# Patient Record
Sex: Male | Born: 1985
Health system: Southern US, Community
[De-identification: ages and names within clinical notes are randomized; demographics above are authoritative.]

## PROBLEM LIST (undated history)

## (undated) DIAGNOSIS — J45909 Unspecified asthma, uncomplicated: Secondary | ICD-10-CM

## (undated) DIAGNOSIS — I10 Essential (primary) hypertension: Secondary | ICD-10-CM

---

## 2020-07-19 ENCOUNTER — Encounter (HOSPITAL_COMMUNITY): Payer: Self-pay | Admitting: Emergency Medicine

## 2020-07-19 ENCOUNTER — Other Ambulatory Visit: Payer: Self-pay

## 2020-07-19 ENCOUNTER — Emergency Department (HOSPITAL_COMMUNITY): Payer: Self-pay

## 2020-07-19 ENCOUNTER — Emergency Department (HOSPITAL_COMMUNITY)
Admission: EM | Admit: 2020-07-19 | Discharge: 2020-07-19 | Disposition: A | Discharge status: Home / Self Care | Payer: Self-pay | Attending: Emergency Medicine | Admitting: Emergency Medicine

## 2020-07-19 DIAGNOSIS — J4541 Moderate persistent asthma with (acute) exacerbation: Secondary | ICD-10-CM

## 2020-07-19 DIAGNOSIS — Z8616 Personal history of COVID-19: Secondary | ICD-10-CM | POA: Insufficient documentation

## 2020-07-19 DIAGNOSIS — Z20822 Contact with and (suspected) exposure to covid-19: Secondary | ICD-10-CM | POA: Insufficient documentation

## 2020-07-19 DIAGNOSIS — I10 Essential (primary) hypertension: Secondary | ICD-10-CM | POA: Insufficient documentation

## 2020-07-19 DIAGNOSIS — J45909 Unspecified asthma, uncomplicated: Secondary | ICD-10-CM | POA: Insufficient documentation

## 2020-07-19 HISTORY — DX: Unspecified asthma, uncomplicated: J45.909

## 2020-07-19 HISTORY — DX: Essential (primary) hypertension: I10

## 2020-07-19 LAB — CBC WITH DIFFERENTIAL/PLATELET
Abs Immature Granulocytes: 0.01 10*3/uL (ref 0.00–0.07)
Basophils Absolute: 0.1 10*3/uL (ref 0.0–0.1)
Basophils Relative: 1 %
Eosinophils Absolute: 0.7 10*3/uL — ABNORMAL HIGH (ref 0.0–0.5)
Eosinophils Relative: 8 %
HCT: 41.9 % (ref 39.0–52.0)
Hemoglobin: 12.9 g/dL — ABNORMAL LOW (ref 13.0–17.0)
Immature Granulocytes: 0 %
Lymphocytes Relative: 47 %
Lymphs Abs: 4.1 10*3/uL — ABNORMAL HIGH (ref 0.7–4.0)
MCH: 26.8 pg (ref 26.0–34.0)
MCHC: 30.8 g/dL (ref 30.0–36.0)
MCV: 87.1 fL (ref 80.0–100.0)
Monocytes Absolute: 0.7 10*3/uL (ref 0.1–1.0)
Monocytes Relative: 8 %
Neutro Abs: 3.2 10*3/uL (ref 1.7–7.7)
Neutrophils Relative %: 36 %
Platelets: 253 10*3/uL (ref 150–400)
RBC: 4.81 MIL/uL (ref 4.22–5.81)
RDW: 15.5 % (ref 11.5–15.5)
WBC: 8.7 10*3/uL (ref 4.0–10.5)
nRBC: 0 % (ref 0.0–0.2)

## 2020-07-19 LAB — BASIC METABOLIC PANEL
Anion gap: 9 (ref 5–15)
BUN: 8 mg/dL (ref 6–20)
CO2: 29 mmol/L (ref 22–32)
Calcium: 9.5 mg/dL (ref 8.9–10.3)
Chloride: 103 mmol/L (ref 98–111)
Creatinine, Ser: 0.75 mg/dL (ref 0.61–1.24)
GFR, Estimated: >60 mL/min (ref >60)
Glucose, Bld: 111 mg/dL — ABNORMAL HIGH (ref 70–99)
Potassium: 4.1 mmol/L (ref 3.5–5.1)
Sodium: 141 mmol/L (ref 135–145)

## 2020-07-19 LAB — RESP PANEL BY RT-PCR (FLU A&B, COVID) ARPGX2
Influenza A by PCR: NEGATIVE
Influenza B by PCR: NEGATIVE
SARS Coronavirus 2 by RT PCR: NEGATIVE

## 2020-07-19 LAB — POC SARS CORONAVIRUS 2 AG -  ED: SARS Coronavirus 2 Ag: NEGATIVE

## 2020-07-19 MED ORDER — METHYLPREDNISOLONE SODIUM SUCC 125 MG IJ SOLR
125.0000 mg | Freq: Once | INTRAMUSCULAR | Status: AC
  Administered 2020-07-19: 125 mg via INTRAVENOUS
  Filled 2020-07-19: qty 2

## 2020-07-19 MED ORDER — PULMICORT FLEXHALER 90 MCG/ACT IN AEPB: 1.0000 | INHALATION_SPRAY | Freq: Two times a day (BID) | RESPIRATORY_TRACT | 0 refills | Status: DC

## 2020-07-19 MED ORDER — IPRATROPIUM-ALBUTEROL 0.5-2.5 (3) MG/3ML IN SOLN
3.0000 mL | Freq: Once | RESPIRATORY_TRACT | Status: AC
  Administered 2020-07-19: 3 mL via RESPIRATORY_TRACT
  Filled 2020-07-19: qty 3

## 2020-07-19 MED ORDER — ALBUTEROL (5 MG/ML) CONTINUOUS INHALATION SOLN
20.0000 mg/h | INHALATION_SOLUTION | Freq: Once | RESPIRATORY_TRACT | Status: AC
  Administered 2020-07-19: 20 mg/h via RESPIRATORY_TRACT
  Filled 2020-07-19: qty 20

## 2020-07-19 MED ORDER — PREDNISONE 20 MG PO TABS: 60.0000 mg | ORAL_TABLET | Freq: Every day | ORAL | 0 refills | Status: DC

## 2020-07-19 MED ORDER — ALBUTEROL SULFATE HFA 108 (90 BASE) MCG/ACT IN AERS
6.0000 | INHALATION_SPRAY | Freq: Once | RESPIRATORY_TRACT | Status: AC
  Administered 2020-07-19: 6 via RESPIRATORY_TRACT
  Filled 2020-07-19: qty 6.7

## 2020-07-19 MED ORDER — MAGNESIUM SULFATE 2 GM/50ML IV SOLN
2.0000 g | Freq: Once | INTRAVENOUS | Status: AC
  Administered 2020-07-19: 2 g via INTRAVENOUS
  Filled 2020-07-19: qty 50

## 2020-07-19 NOTE — ED Triage Notes (Signed)
Patient states since COVID last year, he has had issues with his asthma. Patient states last night he began having a productive cough with green sputum. Patient audibly wheezing, difficulty speaking sentences. Patient states he attempted to use his inhaler and it has not helped.

## 2020-07-19 NOTE — Discharge Instructions (Addendum)
To keep your prescription costs down, check the drug prices using [employer].

## 2020-07-19 NOTE — ED Provider Notes (Signed)
[facility]-EMERGENCY DEPT Provider Note   CSN: [id number] Arrival date & time: 07/19/20  0150   History Chief Complaint  Patient presents with  . Asthma  . Shortness of breath    James Donaldson is a 34 y.o. male.  The history is provided by the patient.  Asthma  He has history of hypertension, asthma and comes in because of worsening asthma problems.  He started having cough and wheezing about 1 week ago and has been using his inhaler.  Inhaler did seem to give relief, but has been working for less and less time.  Today, it got much worse and his inhaler did not seem to be helping.  He has had a cough productive of thick green sputum.  He denies fever, chills, sweats.  He denies loss of sense of smell or taste.  He denies nausea, vomiting, diarrhea.  He denies arthralgias or myalgias.  He denies any sick contacts.  He did have COVID-19 last year, and asthma, which had been quiesced sent prior to the Covid infection, started giving him more problems.  He is a non-smoker.  Past Medical History:  Diagnosis Date  . Asthma   . Hypertension     There are no problems to display for this patient.   History reviewed. No pertinent surgical history.     No family history on file.  Social History   Tobacco Use  . Smoking status: Never Smoker  . Smokeless tobacco: Never Used  Substance Use Topics  . Alcohol use: Yes  . Drug use: Never    Home Medications Prior to Admission medications   Not on File    Allergies    Patient has no known allergies.  Review of Systems   Review of Systems  All other systems reviewed and are negative.   Physical Exam Updated Vital Signs BP (!) 143/102 (BP Location: Left Arm)   Pulse (!) 120   Resp (!) 25   Ht 5\' 7"  (1.702 m)   Wt 95.3 kg   SpO2 96%   BMI 32.89 kg/m   Physical Exam Vitals and nursing note reviewed.   34 year old male, in mild respiratory distress. Vital signs are significant for elevated  heart rate, respiratory rate, blood pressure. Oxygen saturation is 96%, which is normal. Head is normocephalic and atraumatic. PERRLA, EOMI. Oropharynx is clear. Neck is nontender and supple without adenopathy or JVD. Back is nontender and there is no CVA tenderness. Lungs have diffuse inspiratory and expiratory wheezes without rales or rhonchi. Chest is nontender.  He does have some use of accessory muscles of respiration. Heart has regular rate and rhythm without murmur. Abdomen is soft, flat, nontender without masses or hepatosplenomegaly and peristalsis is normoactive. Extremities have no cyanosis or edema, full range of motion is present. Skin is warm and dry without rash. Neurologic: Mental status is normal, cranial nerves are intact, there are no motor or sensory deficits.  ED Results / Procedures / Treatments   Labs (all labs ordered are listed, but only abnormal results are displayed) Labs Reviewed  BASIC METABOLIC PANEL - Abnormal; Notable for the following components:      Result Value   Glucose, Bld 111 (*)    All other components within normal limits  CBC WITH DIFFERENTIAL/PLATELET - Abnormal; Notable for the following components:   Hemoglobin 12.9 (*)    Lymphs Abs 4.1 (*)    Eosinophils Absolute 0.7 (*)    All other components within normal limits  RESP PANEL BY RT-PCR (FLU A&B, COVID) ARPGX2  POC SARS CORONAVIRUS 2 AG -  ED   Radiology DG Chest Port 1 View  Result Date: 07/19/2020 CLINICAL DATA:  Cough EXAM: PORTABLE CHEST 1 VIEW COMPARISON:  None. FINDINGS: There are areas of significant bronchial wall thickening. There are hazy bibasilar airspace opacities. There is no pneumothorax or large pleural effusion. The heart is unremarkable. IMPRESSION: Findings are consistent with reactive airway disease. Electronically Signed   By: Katherine Mantle M.D.   On: 07/19/2020 03:03    Procedures Procedures   Medications Ordered in ED Medications  magnesium sulfate IVPB  2 g 50 mL (has no administration in time range)  methylPREDNISolone sodium succinate (SOLU-MEDROL) 125 mg/2 mL injection 125 mg (has no administration in time range)  ipratropium-albuterol (DUONEB) 0.5-2.5 (3) MG/3ML nebulizer solution 3 mL (has no administration in time range)    ED Course  I have reviewed the triage vital signs and the nursing notes.  Pertinent labs & imaging results that were available during my care of the patient were reviewed by me and considered in my medical decision making (see chart for details).  MDM Rules/Calculators/A&P Asthma exacerbation, likely triggered by viral URI.  Will check chest x-ray and respiratory pathogen panel.  IV started and is given intravenous magnesium and methylprednisolone and he will be given albuterol with ipratropium via nebulizer.  He has no prior records in the [facility] health system, or in Care Everywhere.  Chest x-ray shows no evidence of pneumonia, labs are reassuring.  He had significant improvement with above-noted treatment but still has some wheezing.  We will give him additional albuterol with ipratropium.  Following above-noted treatment, there was still mild to moderate wheezing.  He is given a continuous nebulizer treatment with albuterol.  Following this, the lungs were completely clear.  He is discharged with prescriptions for prednisone and budesonide inhaler.  He is to continue using his albuterol inhaler as needed.  He is given Social research officer, government and referred to pulmonology for further evaluation and treatment.  Return precautions discussed.  James Donaldson was evaluated in Emergency Department on 07/19/2020 for the symptoms described in the history of present illness. He was evaluated in the context of the global COVID-19 pandemic, which necessitated consideration that the patient might be at risk for infection with the SARS-CoV-2 virus that causes COVID-19. Institutional protocols and algorithms that pertain to the  evaluation of patients at risk for COVID-19 are in a state of rapid change based on information released by regulatory bodies including the CDC and federal and state organizations. These policies and algorithms were followed during the patient's care in the ED.  Final Clinical Impression(s) / ED Diagnoses Final diagnoses:  None    Rx / DC Orders ED Discharge Orders    None       Dione Booze, MD 07/19/20 0710

## 2020-10-26 ENCOUNTER — Ambulatory Visit: Payer: Self-pay

## 2020-10-27 ENCOUNTER — Ambulatory Visit (INDEPENDENT_AMBULATORY_CARE_PROVIDER_SITE_OTHER): Payer: Self-pay | Admitting: Nurse Practitioner

## 2020-10-27 ENCOUNTER — Other Ambulatory Visit: Payer: Self-pay | Admitting: Nurse Practitioner

## 2020-10-27 VITALS — BP 116/103 | HR 108 | Temp 97.9°F | Resp 18

## 2020-10-27 DIAGNOSIS — J4540 Moderate persistent asthma, uncomplicated: Secondary | ICD-10-CM

## 2020-10-27 DIAGNOSIS — Z8616 Personal history of COVID-19: Secondary | ICD-10-CM

## 2020-10-27 DIAGNOSIS — R0602 Shortness of breath: Secondary | ICD-10-CM

## 2020-10-27 MED ORDER — BUDESONIDE-FORMOTEROL FUMARATE 80-4.5 MCG/ACT IN AERO: 2.0000 | INHALATION_SPRAY | Freq: Two times a day (BID) | RESPIRATORY_TRACT | 3 refills | Status: DC

## 2020-10-27 MED ORDER — OMEPRAZOLE 20 MG PO CPDR: 20.0000 mg | DELAYED_RELEASE_CAPSULE | Freq: Every day | ORAL | 3 refills | Status: DC

## 2020-10-27 MED ORDER — ALBUTEROL SULFATE HFA 108 (90 BASE) MCG/ACT IN AERS: 2.0000 | INHALATION_SPRAY | Freq: Four times a day (QID) | RESPIRATORY_TRACT | 0 refills | Status: DC | PRN

## 2020-10-27 MED ORDER — MONTELUKAST SODIUM 10 MG PO TABS: 10.0000 mg | ORAL_TABLET | Freq: Every day | ORAL | 3 refills | Status: DC

## 2020-10-27 MED ORDER — MONTELUKAST SODIUM 10 MG PO TABS
10.0000 mg | ORAL_TABLET | Freq: Every day | ORAL | 3 refills | Status: DC
Start: 2020-10-27 — End: 2021-08-31

## 2020-10-27 MED ORDER — CETIRIZINE HCL 10 MG PO TABS
10.0000 mg | ORAL_TABLET | Freq: Every day | ORAL | 11 refills | Status: DC
Start: 2020-10-27 — End: 2021-08-31

## 2020-10-27 MED ORDER — BUDESONIDE-FORMOTEROL FUMARATE 80-4.5 MCG/ACT IN AERO
2.0000 | INHALATION_SPRAY | Freq: Two times a day (BID) | RESPIRATORY_TRACT | 3 refills | Status: DC
Start: 2020-10-27 — End: 2021-09-22

## 2020-10-27 MED ORDER — CETIRIZINE HCL 10 MG PO TABS: 10.0000 mg | ORAL_TABLET | Freq: Every day | ORAL | 11 refills | Status: DC

## 2020-10-27 MED ORDER — OMEPRAZOLE 20 MG PO CPDR
20.0000 mg | DELAYED_RELEASE_CAPSULE | Freq: Every day | ORAL | 3 refills | Status: DC
Start: 2020-10-27 — End: 2021-09-22

## 2020-10-27 NOTE — Progress Notes (Signed)
@Patient  ID: James Donaldson, male    DOB: 09-Nov-1985, 35 y.o.   MRN: [id number]  Chief Complaint  Patient presents with  . Covid Positive    Shortness of breath, history of covid    Referring provider: No ref. provider found  HPI  Patient presents today for post COVID care clinic visit.  Patient states that he tested positive for Covid in March 2021.  Patient does have past history of asthma.  He states that he is having ongoing cough and shortness of breath.  Patient does not currently have an inhaler. Denies f/c/s, n/v/d, hemoptysis, PND, chest pain or edema.     Allergies  Allergen Reactions  . Kiwi Extract Itching and Swelling     There is no immunization history on file for this patient.  Past Medical History:  Diagnosis Date  . Asthma   . Hypertension     Tobacco History: Social History   Tobacco Use  Smoking Status Never Smoker  Smokeless Tobacco Never Used   Counseling given: Yes   Outpatient Encounter Medications as of 10/27/2020  Medication Sig  . albuterol (VENTOLIN HFA) 108 (90 Base) MCG/ACT inhaler Inhale 2 puffs into the lungs every 6 (six) hours as needed for wheezing or shortness of breath.  . [DISCONTINUED] budesonide-formoterol (SYMBICORT) 80-4.5 MCG/ACT inhaler Inhale 2 puffs into the lungs 2 (two) times daily.  . [DISCONTINUED] cetirizine (ZYRTEC) 10 MG tablet Take 1 tablet (10 mg total) by mouth daily.  . [DISCONTINUED] montelukast (SINGULAIR) 10 MG tablet Take 1 tablet (10 mg total) by mouth at bedtime.  . [DISCONTINUED] omeprazole (PRILOSEC) 20 MG capsule Take 1 capsule (20 mg total) by mouth daily.  10/29/2020 acetaminophen (TYLENOL) 500 MG tablet Take 1,000 mg by mouth every 6 (six) hours as needed for mild pain or headache.  Marland Kitchen aspirin-acetaminophen-caffeine (EXCEDRIN MIGRAINE) 250-250-65 MG tablet Take 2 tablets by mouth every 6 (six) hours as needed for headache.  . budesonide-formoterol (SYMBICORT) 80-4.5 MCG/ACT inhaler Inhale 2 puffs into the  lungs 2 (two) times daily.  . cetirizine (ZYRTEC) 10 MG tablet Take 1 tablet (10 mg total) by mouth daily.  . montelukast (SINGULAIR) 10 MG tablet Take 1 tablet (10 mg total) by mouth at bedtime.  Marland Kitchen omeprazole (PRILOSEC) 20 MG capsule Take 1 capsule (20 mg total) by mouth daily.  . predniSONE (DELTASONE) 20 MG tablet Take 3 tablets (60 mg total) by mouth daily.  . [DISCONTINUED] Budesonide (PULMICORT FLEXHALER) 90 MCG/ACT inhaler Inhale 1 puff into the lungs 2 (two) times daily.   No facility-administered encounter medications on file as of 10/27/2020.     Review of Systems  Review of Systems  Constitutional: Negative.  Negative for fatigue and fever.  HENT: Negative.   Respiratory: Positive for cough and shortness of breath.   Cardiovascular: Negative.  Negative for chest pain, palpitations and leg swelling.  Gastrointestinal: Negative.   Allergic/Immunologic: Negative.   Neurological: Negative.   Psychiatric/Behavioral: Negative.        Physical Exam  BP (!) 116/103   Pulse (!) 108   Temp 97.9 F (36.6 C)   Resp 18   SpO2 98% Comment: RA  Wt Readings from Last 5 Encounters:  07/19/20 210 lb (95.3 kg)     Physical Exam Vitals and nursing note reviewed.  Constitutional:      General: He is not in acute distress.    Appearance: He is well-developed.  Cardiovascular:     Rate and Rhythm: Normal rate and regular rhythm.  Pulmonary:     Effort: Pulmonary effort is normal.     Breath sounds: Normal breath sounds.  Skin:    General: Skin is warm and dry.  Neurological:     Mental Status: He is alert and oriented to person, place, and time.        Assessment & Plan:   History of COVID-19 Cough History of asthma:   Stay well hydrated  Stay active  Deep breathing exercises  May take tylenol or fever or pain  Will order omeprazole  Will order Symbicort - 2 puffs twice daily  Will order Singulair - at bedtime  Will order zyrtec - to take in the  morning  Will order albuterol inhaler      Will order chest x ray:  [facility] [address] [place], [place] [address] [phone] MON - FRI 8:00 AM - 4:00 PM - WALK IN   Follow up:  Follow up in 2 weeks or sooner if needed      Ivonne Andrew, NP 11/09/2020

## 2020-10-27 NOTE — Patient Instructions (Signed)
History of Covid  Cough History of asthma:   Stay well hydrated  Stay active  Deep breathing exercises  May take tylenol or fever or pain  Will order omeprazole  Will order Symbicort - 2 puffs twice daily  Will order Singulair - at bedtime  Will order zyrtec - to take in the morning  Will order albuterol inhaler      Will order chest x ray:  [facility] [address] [place], [place] [address] [phone] MON - FRI 8:00 AM - 4:00 PM - WALK IN   Follow up:  Follow up in 2 weeks or sooner if needed

## 2020-11-09 DIAGNOSIS — R0602 Shortness of breath: Secondary | ICD-10-CM | POA: Insufficient documentation

## 2020-11-09 DIAGNOSIS — J4540 Moderate persistent asthma, uncomplicated: Secondary | ICD-10-CM | POA: Insufficient documentation

## 2020-11-09 DIAGNOSIS — Z8616 Personal history of COVID-19: Secondary | ICD-10-CM | POA: Insufficient documentation

## 2020-11-09 NOTE — Assessment & Plan Note (Signed)
Cough History of asthma:   Stay well hydrated  Stay active  Deep breathing exercises  May take tylenol or fever or pain  Will order omeprazole  Will order Symbicort - 2 puffs twice daily  Will order Singulair - at bedtime  Will order zyrtec - to take in the morning  Will order albuterol inhaler      Will order chest x ray:  [facility] [address] [place], [place] [address] [phone] MON - FRI 8:00 AM - 4:00 PM - WALK IN   Follow up:  Follow up in 2 weeks or sooner if needed

## 2021-06-10 ENCOUNTER — Other Ambulatory Visit: Payer: Self-pay

## 2021-06-10 ENCOUNTER — Other Ambulatory Visit: Payer: Self-pay | Admitting: Nurse Practitioner

## 2021-06-10 MED FILL — Budesonide-Formoterol Fumarate Dihyd Aerosol 80-4.5 MCG/ACT: RESPIRATORY_TRACT | 30 days supply | Qty: 10.2 | Fill #0 | Status: CN

## 2021-06-17 ENCOUNTER — Other Ambulatory Visit: Payer: Self-pay

## 2021-08-02 ENCOUNTER — Other Ambulatory Visit: Payer: Self-pay

## 2021-08-31 ENCOUNTER — Encounter: Payer: Self-pay | Admitting: Pulmonary Disease

## 2021-08-31 ENCOUNTER — Telehealth (INDEPENDENT_AMBULATORY_CARE_PROVIDER_SITE_OTHER): Payer: Self-pay | Admitting: Pulmonary Disease

## 2021-08-31 DIAGNOSIS — R0683 Snoring: Secondary | ICD-10-CM

## 2021-09-01 ENCOUNTER — Telehealth: Payer: Self-pay | Admitting: Pulmonary Disease

## 2021-09-07 ENCOUNTER — Encounter: Payer: Self-pay | Admitting: Pulmonary Disease

## 2021-09-07 NOTE — Progress Notes (Signed)
Visit was unsuccesful

## 2021-09-22 ENCOUNTER — Ambulatory Visit (INDEPENDENT_AMBULATORY_CARE_PROVIDER_SITE_OTHER): Payer: No Typology Code available for payment source | Admitting: Pulmonary Disease

## 2021-09-22 ENCOUNTER — Other Ambulatory Visit: Payer: Self-pay

## 2021-09-22 ENCOUNTER — Encounter: Payer: Self-pay | Admitting: Pulmonary Disease

## 2021-09-22 ENCOUNTER — Ambulatory Visit (INDEPENDENT_AMBULATORY_CARE_PROVIDER_SITE_OTHER): Payer: No Typology Code available for payment source

## 2021-09-22 VITALS — BP 124/86 | HR 84 | Temp 98.2°F | Ht 67.0 in | Wt 268.0 lb

## 2021-09-22 DIAGNOSIS — R0683 Snoring: Secondary | ICD-10-CM | POA: Diagnosis not present

## 2021-09-22 DIAGNOSIS — R609 Edema, unspecified: Secondary | ICD-10-CM | POA: Diagnosis not present

## 2021-09-22 DIAGNOSIS — J4551 Severe persistent asthma with (acute) exacerbation: Secondary | ICD-10-CM

## 2021-09-22 DIAGNOSIS — Z0000 Encounter for general adult medical examination without abnormal findings: Secondary | ICD-10-CM

## 2021-09-22 MED ORDER — ALBUTEROL SULFATE (2.5 MG/3ML) 0.083% IN NEBU: 2.5000 mg | INHALATION_SOLUTION | Freq: Four times a day (QID) | RESPIRATORY_TRACT | 5 refills | Status: DC | PRN

## 2021-09-22 MED ORDER — ALBUTEROL SULFATE HFA 108 (90 BASE) MCG/ACT IN AERS: 2.0000 | INHALATION_SPRAY | Freq: Four times a day (QID) | RESPIRATORY_TRACT | 5 refills | Status: DC | PRN

## 2021-09-22 MED ORDER — MONTELUKAST SODIUM 10 MG PO TABS: 10.0000 mg | ORAL_TABLET | Freq: Every day | ORAL | 5 refills | Status: DC

## 2021-09-22 MED ORDER — PREDNISONE 10 MG PO TABS: ORAL_TABLET | ORAL | 0 refills | Status: AC

## 2021-09-22 MED ORDER — BUDESONIDE-FORMOTEROL FUMARATE 160-4.5 MCG/ACT IN AERO: 2.0000 | INHALATION_SPRAY | Freq: Two times a day (BID) | RESPIRATORY_TRACT | 6 refills | Status: DC

## 2021-09-22 NOTE — Progress Notes (Signed)
[facility]  Chief Complaint  Patient presents with   Consult    Self referral for snoring. Per patient and wife, he stops breathing during his sleep. Denies ever having a sleep study before.     Past Surgical History:  He  has no past surgical history on file.  Past Medical History:  Hypertension, Asthma, COVID 19 pneumonia  Constitutional:  BP 124/86    Pulse 84    Temp 98.2 F (36.8 C) (Oral)    Ht 5\' 7"  (1.702 m)    Wt 268 lb (121.6 kg)    SpO2 97% Comment: on RA   BMI 41.97 kg/m   Brief Summary:  James Donaldson is a 36 y.o. male with snoring and asthma.      Subjective:   He is here with his wife who is an LPN.  They recently moved from [place]  He has been getting cough and wheeze.  Needed to use his albuterol more.    He snores, and stops breathing at night.  This has gotten worse since he gained about 70 lbs.  Wakes up frequently to use the bathroom.  Feels tired all the time, and can fall asleep when sitting quiet. He does not use anything to help him fall sleep or stay awake.  He denies sleep walking, sleep talking, bruxism, or nightmares.  There is no history of restless legs.  He denies sleep hallucinations, sleep paralysis, or cataplexy.  The Epworth score is 16 out of 24.   Physical Exam:   Appearance - well kempt   ENMT - no sinus tenderness, no oral exudate, no LAN, Mallampati 3 airway, no stridor  Respiratory - b/l wheezing  CV - s1s2 regular rate and rhythm, no murmurs  Ext - no clubbing, no edema  Skin - no rashes  Psych - normal mood and affect   Pulmonary testing:    Chest Imaging:    Sleep Tests:    Cardiac Tests:    Social History:  He  reports that he has never smoked. He has never used smokeless tobacco. He reports current alcohol use. He reports that he does not use drugs.  Family History:  His family history includes Asthma in his mother.    Discussion:  He has  history of allergies and asthma.  He has an acute exacerbation.  He has snoring, sleep disruption, apnea, and daytime sleepiness.  His BMI is > 35.  I am concerned he could have obstructive sleep apnea.  Assessment/Plan:   Severe, persistent asthma with an exacerbation. - will give him course of prednisone - continue symbicort 160 two puffs bid - add singulair 10 mg nightly - prn albuterol - will arrange for CBC with diff, PFT, CXR  Intermittent leg edema. - will check CMET, BNP, D dimer  Snoring with excessive daytime sleepiness. - will need to arrange for a home sleep study  Obesity. - discussed how weight can impact sleep and risk for sleep disordered breathing - discussed options to assist with weight loss: combination of diet modification, cardiovascular and strength training exercises  Cardiovascular risk. - had an extensive discussion regarding the adverse health consequences related to untreated sleep disordered breathing - specifically discussed the risks for hypertension, coronary artery disease, cardiac dysrhythmias, cerebrovascular disease, and diabetes - lifestyle modification discussed  Safe driving practices. - discussed how sleep disruption can increase risk of accidents, particularly when driving - safe driving practices were discussed  Therapies for  obstructive sleep apnea. - if the sleep study shows significant sleep apnea, then various therapies for treatment were reviewed: CPAP, oral appliance, and surgical interventions  Health care maintenance. - will arrange for referral to primary care  Time Spent Involved in Patient Care on Day of Examination:  49 minutes  Follow up:   Patient Instructions  Chest xray and lab tests today Will arrange for pulmonary function test and home sleep study Prednisone 10 mg pill >> 3 pills daily for 2 days, 2 pills daily for 2 days, 1 pill daily for 2 days Symbicort two puffs in the morning and two puffs in the  evening Singulair 10 mg pill nightly Albuterol every 6 hours as needed for cough, wheeze, or chest congestion Will arrange for referral to primary care for health maintenance Follow up in 2 weeks with Dr. Craige Cotta or Nurse Practitioner  Medication List:   Allergies as of 09/22/2021       Reactions   Kiwi Extract Itching, Swelling   Per patient his lips swell.         Medication List        Accurate as of September 22, 2021  5:34 PM. If you have any questions, ask your nurse or doctor.          STOP taking these medications    budesonide-formoterol 80-4.5 MCG/ACT inhaler Commonly known as: SYMBICORT Replaced by: budesonide-formoterol 160-4.5 MCG/ACT inhaler Stopped by: Coralyn Helling, MD   cetirizine 10 MG tablet Commonly known as: ZYRTEC Stopped by: Coralyn Helling, MD   omeprazole 20 MG capsule Commonly known as: PRILOSEC Stopped by: Coralyn Helling, MD   Symbicort 80-4.5 MCG/ACT inhaler Generic drug: budesonide-formoterol Stopped by: Coralyn Helling, MD       TAKE these medications    acetaminophen 500 MG tablet Commonly known as: TYLENOL Take 1,000 mg by mouth every 6 (six) hours as needed for mild pain or headache.   albuterol 108 (90 Base) MCG/ACT inhaler Commonly known as: VENTOLIN HFA Inhale 2 puffs into the lungs every 6 (six) hours as needed for wheezing or shortness of breath. What changed: Another medication with the same name was added. Make sure you understand how and when to take each. Changed by: Coralyn Helling, MD   albuterol (2.5 MG/3ML) 0.083% nebulizer solution Commonly known as: PROVENTIL Take 3 mLs (2.5 mg total) by nebulization every 6 (six) hours as needed for wheezing or shortness of breath. What changed: You were already taking a medication with the same name, and this prescription was added. Make sure you understand how and when to take each. Changed by: Coralyn Helling, MD   aspirin-acetaminophen-caffeine [phone] MG tablet Commonly known as:  EXCEDRIN MIGRAINE Take 2 tablets by mouth every 6 (six) hours as needed for headache.   budesonide-formoterol 160-4.5 MCG/ACT inhaler Commonly known as: SYMBICORT Inhale 2 puffs into the lungs 2 (two) times daily. Replaces: budesonide-formoterol 80-4.5 MCG/ACT inhaler Started by: Coralyn Helling, MD   montelukast 10 MG tablet Commonly known as: SINGULAIR Take 1 tablet (10 mg total) by mouth at bedtime.   predniSONE 10 MG tablet Commonly known as: DELTASONE Take 3 tablets (30 mg total) by mouth daily with breakfast for 2 days, THEN 2 tablets (20 mg total) daily with breakfast for 2 days, THEN 1 tablet (10 mg total) daily with breakfast for 2 days. Start taking on: September 22, 2021 Started by: Coralyn Helling, MD        Signature:  Coralyn Helling, MD [facility] Pulmonary/Critical Care Pager - [phone] 09/22/2021, 5:34 PM

## 2021-09-22 NOTE — Patient Instructions (Signed)
Chest xray and lab tests today Will arrange for pulmonary function test and home sleep study Prednisone 10 mg pill >> 3 pills daily for 2 days, 2 pills daily for 2 days, 1 pill daily for 2 days Symbicort two puffs in the morning and two puffs in the evening Singulair 10 mg pill nightly Albuterol every 6 hours as needed for cough, wheeze, or chest congestion Will arrange for referral to primary care for health maintenance Follow up in 2 weeks with Dr. Craige Cotta or Nurse Practitioner

## 2021-09-28 ENCOUNTER — Encounter: Payer: Self-pay | Admitting: Pulmonary Disease

## 2021-10-01 NOTE — Telephone Encounter (Signed)
Seems like encounter was open in error so closing encounter.  

## 2021-11-03 ENCOUNTER — Encounter: Payer: Self-pay | Admitting: Pulmonary Disease

## 2021-12-17 ENCOUNTER — Emergency Department (HOSPITAL_BASED_OUTPATIENT_CLINIC_OR_DEPARTMENT_OTHER)
Admission: EM | Admit: 2021-12-17 | Discharge: 2021-12-17 | Disposition: A | Discharge status: Home / Self Care | Payer: No Typology Code available for payment source | Attending: Emergency Medicine | Admitting: Emergency Medicine

## 2021-12-17 ENCOUNTER — Other Ambulatory Visit: Payer: Self-pay

## 2021-12-17 ENCOUNTER — Emergency Department (HOSPITAL_BASED_OUTPATIENT_CLINIC_OR_DEPARTMENT_OTHER): Payer: No Typology Code available for payment source

## 2021-12-17 ENCOUNTER — Encounter (HOSPITAL_BASED_OUTPATIENT_CLINIC_OR_DEPARTMENT_OTHER): Payer: Self-pay

## 2021-12-17 ENCOUNTER — Emergency Department (HOSPITAL_BASED_OUTPATIENT_CLINIC_OR_DEPARTMENT_OTHER): Payer: No Typology Code available for payment source | Admitting: Radiology

## 2021-12-17 DIAGNOSIS — Z7951 Long term (current) use of inhaled steroids: Secondary | ICD-10-CM | POA: Insufficient documentation

## 2021-12-17 DIAGNOSIS — R109 Unspecified abdominal pain: Secondary | ICD-10-CM | POA: Diagnosis not present

## 2021-12-17 DIAGNOSIS — D72829 Elevated white blood cell count, unspecified: Secondary | ICD-10-CM | POA: Insufficient documentation

## 2021-12-17 DIAGNOSIS — Y92411 Interstate highway as the place of occurrence of the external cause: Secondary | ICD-10-CM | POA: Insufficient documentation

## 2021-12-17 DIAGNOSIS — I10 Essential (primary) hypertension: Secondary | ICD-10-CM | POA: Insufficient documentation

## 2021-12-17 DIAGNOSIS — M25511 Pain in right shoulder: Secondary | ICD-10-CM | POA: Insufficient documentation

## 2021-12-17 DIAGNOSIS — J45909 Unspecified asthma, uncomplicated: Secondary | ICD-10-CM | POA: Insufficient documentation

## 2021-12-17 DIAGNOSIS — E041 Nontoxic single thyroid nodule: Secondary | ICD-10-CM

## 2021-12-17 LAB — COMPREHENSIVE METABOLIC PANEL
ALT: 16 U/L (ref 0–44)
AST: 17 U/L (ref 15–41)
Albumin: 4.6 g/dL (ref 3.5–5.0)
Alkaline Phosphatase: 58 U/L (ref 38–126)
Anion gap: 6 (ref 5–15)
BUN: 9 mg/dL (ref 6–20)
CO2: 31 mmol/L (ref 22–32)
Calcium: 9.9 mg/dL (ref 8.9–10.3)
Chloride: 102 mmol/L (ref 98–111)
Creatinine, Ser: 0.76 mg/dL (ref 0.61–1.24)
GFR, Estimated: >60 mL/min (ref >60)
Glucose, Bld: 94 mg/dL (ref 70–99)
Potassium: 4.1 mmol/L (ref 3.5–5.1)
Sodium: 139 mmol/L (ref 135–145)
Total Bilirubin: 0.3 mg/dL (ref 0.3–1.2)
Total Protein: 7.9 g/dL (ref 6.5–8.1)

## 2021-12-17 LAB — CBC WITH DIFFERENTIAL/PLATELET
Abs Immature Granulocytes: 0.03 10*3/uL (ref 0.00–0.07)
Basophils Absolute: 0 10*3/uL (ref 0.0–0.1)
Basophils Relative: 0 %
Eosinophils Absolute: 0.2 10*3/uL (ref 0.0–0.5)
Eosinophils Relative: 2 %
HCT: 40.5 % (ref 39.0–52.0)
Hemoglobin: 12.6 g/dL — ABNORMAL LOW (ref 13.0–17.0)
Immature Granulocytes: 0 %
Lymphocytes Relative: 22 %
Lymphs Abs: 2.4 10*3/uL (ref 0.7–4.0)
MCH: 27.5 pg (ref 26.0–34.0)
MCHC: 31.1 g/dL (ref 30.0–36.0)
MCV: 88.2 fL (ref 80.0–100.0)
Monocytes Absolute: 0.8 10*3/uL (ref 0.1–1.0)
Monocytes Relative: 7 %
Neutro Abs: 7.6 10*3/uL (ref 1.7–7.7)
Neutrophils Relative %: 69 %
Platelets: 244 10*3/uL (ref 150–400)
RBC: 4.59 MIL/uL (ref 4.22–5.81)
RDW: 15.7 % — ABNORMAL HIGH (ref 11.5–15.5)
WBC: 11.1 10*3/uL — ABNORMAL HIGH (ref 4.0–10.5)
nRBC: 0 % (ref 0.0–0.2)

## 2021-12-17 MED ORDER — METHOCARBAMOL 500 MG PO TABS: 500.0000 mg | ORAL_TABLET | Freq: Two times a day (BID) | ORAL | 0 refills | Status: DC

## 2021-12-17 MED ORDER — IOHEXOL 300 MG/ML  SOLN
100.0000 mL | Freq: Once | INTRAMUSCULAR | Status: AC | PRN
  Administered 2021-12-17: 100 mL via INTRAVENOUS

## 2021-12-17 MED ORDER — OXYCODONE-ACETAMINOPHEN 5-325 MG PO TABS
1.0000 | ORAL_TABLET | Freq: Once | ORAL | Status: AC
  Administered 2021-12-17: 1 via ORAL
  Filled 2021-12-17: qty 1
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
## 2021-12-17 NOTE — ED Notes (Signed)
Pt taken to imaging
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
## 2022-01-26 ENCOUNTER — Ambulatory Visit: Payer: No Typology Code available for payment source | Admitting: Emergency Medicine

## 2022-03-04 ENCOUNTER — Ambulatory Visit: Payer: No Typology Code available for payment source | Admitting: Nurse Practitioner

## 2022-09-15 DEATH — deceased

## 2022-10-24 ENCOUNTER — Telehealth: Payer: Self-pay | Admitting: Pulmonary Disease

## 2022-10-24 NOTE — Telephone Encounter (Signed)
I received a call from James Donaldson - she is co-parent to the patient's oldest daughter [name].  Patient lived in [place] and passed away 10/01/2022.   There is a life Set designer for the daughter, and insurance company is asking for a Librarian, academic.  She stated she has been in contact with the patient's wife, Psychologist, counselling, and was given Financial controller Pulmonary of one of the doctors the patient saw while living in [place].  I explained to [name] that the patient was seen once by Dr. Halford Chessman and that he may not be willing to complete the form.  I also explained to her that we would not be able to complete the form or provide any information without a signed Release of Information form from [name].  [name] stated she understands and will email me a copy of the form.
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
